# Patient Record
Sex: Female | Born: 2000 | Race: White | Hispanic: No | Marital: Single | Smoking: Never smoker
Health system: Southern US, Community
[De-identification: ages and names within clinical notes are randomized; demographics above are authoritative.]

## PROBLEM LIST (undated history)

## (undated) DIAGNOSIS — A630 Anogenital (venereal) warts: Secondary | ICD-10-CM

## (undated) DIAGNOSIS — F39 Unspecified mood [affective] disorder: Secondary | ICD-10-CM

## (undated) DIAGNOSIS — A549 Gonococcal infection, unspecified: Secondary | ICD-10-CM

## (undated) DIAGNOSIS — F32A Depression, unspecified: Secondary | ICD-10-CM

## (undated) DIAGNOSIS — F909 Attention-deficit hyperactivity disorder, unspecified type: Secondary | ICD-10-CM

## (undated) DIAGNOSIS — F329 Major depressive disorder, single episode, unspecified: Secondary | ICD-10-CM

## (undated) HISTORY — PX: NO PAST SURGERIES: SHX2092

## (undated) HISTORY — DX: Gonococcal infection, unspecified: A54.9

## (undated) HISTORY — DX: Anogenital (venereal) warts: A63.0

---
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
## 2021-05-01 ENCOUNTER — Telehealth: Payer: Self-pay

## 2021-05-01 NOTE — Telephone Encounter (Signed)
Pt called to see when she needed to have her IUD removed, she's aware she got it in 2019 and will need to be removed  in 2004

## 2021-09-11 ENCOUNTER — Encounter: Payer: Self-pay | Admitting: Obstetrics

## 2021-09-11 ENCOUNTER — Telehealth: Payer: Self-pay | Admitting: Obstetrics

## 2021-09-11 NOTE — Telephone Encounter (Signed)
Called pt unable to lm- voicemail not set up- no active mychart - sent letter in mail making pt aware of appointment change due to practice being in a meeting.

## 2021-09-21 ENCOUNTER — Encounter: Payer: Self-pay | Admitting: Obstetrics

## 2021-11-01 ENCOUNTER — Encounter: Payer: Self-pay | Admitting: Obstetrics

## 2021-11-02 ENCOUNTER — Ambulatory Visit (INDEPENDENT_AMBULATORY_CARE_PROVIDER_SITE_OTHER): Payer: Medicaid Other | Admitting: Obstetrics

## 2021-11-02 VITALS — BP 120/83 | HR 78 | Ht 63.0 in | Wt 124.6 lb

## 2021-11-02 DIAGNOSIS — Z30431 Encounter for routine checking of intrauterine contraceptive device: Secondary | ICD-10-CM

## 2021-11-02 NOTE — Progress Notes (Signed)
GYN ENCOUNTER ? ?Encounter for IUD Check ? ?Subjective ? ?HPI: Tracy Roberts is a 21 y.o. G1P0000 who presents today for an IUD check. She reports that she is not having any problems with it but has never been able to feel the strings. She denies abdominal pain, cramping, and heavy bleeding. She plans to keep her IUD until next year when it expires, and then is considering a pregnancy. ? ?Past Medical History:  ?Diagnosis Date  ? ADHD (attention deficit hyperactivity disorder)   ? Depression   ? Genital warts   ? Gonorrhea   ? Mood disorder (HCC)   ? ?Past Surgical History:  ?Procedure Laterality Date  ? NO PAST SURGERIES    ? ?OB History   ? ? Gravida  ?1  ? Para  ?0  ? Term  ?0  ? Preterm  ?0  ? AB  ?0  ? Living  ?0  ?  ? ? SAB  ?0  ? IAB  ?0  ? Ectopic  ?0  ? Multiple  ?0  ? Live Births  ?0  ?   ?  ?  ? ?No Known Allergies ? ?ROS: ? ?Patient denies significant menstrual problems., abnormal vaginal bleeding, dyspareunia ?History obtained from the patient ? ? ?Objective ? ?BP 120/83   Pulse 78   Ht 5\' 3"  (1.6 m)   Wt 124 lb 9.6 oz (56.5 kg)   LMP 10/24/2021 (Exact Date)   BMI 22.07 kg/m?  ? ?Pelvic: External genitalia normal, cervix normal in appearance, no CMT, yellowish discharge, IUD strings visible and about 2-3 cm in length. ? ?Assessment ? ?1) IUD in appropriate place ? ?Plan ? ?1) IUD removal in September 2024. Shandora plans to attempt conception at that time. ? ? ?October 2024, CNM ? ? ?  ?

## 2022-03-11 ENCOUNTER — Telehealth: Payer: Self-pay | Admitting: Obstetrics

## 2022-03-11 NOTE — Telephone Encounter (Signed)
Patient is scheduled for [place] then apt with Missy on 03/12/2022 patient had no concerns or questions.

## 2022-03-11 NOTE — Telephone Encounter (Signed)
Patient is calling with concerns of having 4 pregnancy test coming back positive. Patient states she has an IUD and doesn't  know what to do. Patient is thinking about contacting her pcp for blood work to be ordered. Please advise?

## 2022-03-12 ENCOUNTER — Ambulatory Visit (INDEPENDENT_AMBULATORY_CARE_PROVIDER_SITE_OTHER): Payer: Medicaid Other | Admitting: Obstetrics

## 2022-03-12 ENCOUNTER — Ambulatory Visit (INDEPENDENT_AMBULATORY_CARE_PROVIDER_SITE_OTHER): Payer: Medicaid Other

## 2022-03-12 ENCOUNTER — Other Ambulatory Visit: Payer: Self-pay

## 2022-03-12 VITALS — BP 115/79 | HR 63 | Wt 126.1 lb

## 2022-03-12 DIAGNOSIS — Z30432 Encounter for removal of intrauterine contraceptive device: Secondary | ICD-10-CM | POA: Diagnosis not present

## 2022-03-12 DIAGNOSIS — Z3201 Encounter for pregnancy test, result positive: Secondary | ICD-10-CM

## 2022-03-12 DIAGNOSIS — N912 Amenorrhea, unspecified: Secondary | ICD-10-CM

## 2022-03-12 DIAGNOSIS — Z975 Presence of (intrauterine) contraceptive device: Secondary | ICD-10-CM

## 2022-03-12 DIAGNOSIS — Z3200 Encounter for pregnancy test, result unknown: Secondary | ICD-10-CM

## 2022-03-12 LAB — POCT URINE PREGNANCY: Preg Test, Ur: NEGATIVE

## 2022-03-12 NOTE — Progress Notes (Signed)
GYN ENCOUNTER  Subjective  HPI: Tracy Roberts is a 21 y.o. G1P0000 who presents today for IUD removal. She had 5 faintly positive HPTs. Her bhCG yesterday was <5 and UPT today is negative. [place] today shows IUD in the proper place with possible early gestational sac noted near the arm.  Her LMP was 02/25/22, and she reports it only lasted 2 days, and her period normally lasts 4-7 days. She would like her IUD removed today and plans to attempt pregnancy if she is not currently pregnant.  Past Medical History:  Diagnosis Date   ADHD (attention deficit hyperactivity disorder)    Depression    Genital warts    Gonorrhea    Mood disorder (HCC)    Past Surgical History:  Procedure Laterality Date   NO PAST SURGERIES     OB History     Gravida  1   Para  0   Term  0   Preterm  0   AB  0   Living  0      SAB  0   IAB  0   Ectopic  0   Multiple  0   Live Births  0          No Known Allergies  ROS Negative except as noted in HPI History obtained from the patient  Objective  BP 115/79   Pulse 63   Wt 126 lb 1.6 oz (57.2 kg)   LMP 02/25/2022 (Exact Date)   BMI 22.34 kg/m   General appearance: alert, cooperative, no distress Pelvic: External genitalia appear mildly erythematous. Vagina normal. Cervix normal with copious white discharge.  Procedure Note Consent was obtained prior to the procedure. Kathrina declines the presence of a chaperone (partner is present). IUD strings were visualized at the cervix and grasped with ring forceps. IUD easily removed with gentle traction and found to be intact. No bleeding noted  Yaslin tolerated the procedure well. Post-procedure care reviewed.  Assessment Negative pregnancy test Desires IUD removal Desires pregnancy  Plan 1) If no period in one month, repeat pregnancy test 2) IUD removed 3) Discussed fertility awareness, tracking cycle, recognizing ovulation, timing of intercourse. Recommend healthy lifestyle changes  and daily PNV with folic acid.  Return to care with positive HPT or PRN.   Guadlupe Spanish, CNM

## 2022-03-19 ENCOUNTER — Encounter: Payer: Self-pay | Admitting: Obstetrics

## 2023-01-29 ENCOUNTER — Ambulatory Visit: Payer: Self-pay | Admitting: Licensed Practical Nurse
